# Patient Record
Sex: Female | Born: 1990 | Race: Black or African American | Hispanic: No | Marital: Single | Smoking: Current every day smoker
Health system: Southern US, Community
[De-identification: ages and names within clinical notes are randomized; demographics above are authoritative.]

## PROBLEM LIST (undated history)

## (undated) DIAGNOSIS — I10 Essential (primary) hypertension: Secondary | ICD-10-CM

## (undated) HISTORY — PX: CHOLECYSTECTOMY: SHX55

---

## 2019-03-05 ENCOUNTER — Emergency Department (HOSPITAL_BASED_OUTPATIENT_CLINIC_OR_DEPARTMENT_OTHER): Payer: Self-pay

## 2019-03-05 ENCOUNTER — Encounter (HOSPITAL_BASED_OUTPATIENT_CLINIC_OR_DEPARTMENT_OTHER): Payer: Self-pay | Admitting: Emergency Medicine

## 2019-03-05 ENCOUNTER — Other Ambulatory Visit: Payer: Self-pay

## 2019-03-05 ENCOUNTER — Emergency Department (HOSPITAL_BASED_OUTPATIENT_CLINIC_OR_DEPARTMENT_OTHER)
Admission: EM | Admit: 2019-03-05 | Discharge: 2019-03-05 | Disposition: A | Discharge status: Home / Self Care | Payer: Self-pay | Attending: Emergency Medicine | Admitting: Emergency Medicine

## 2019-03-05 DIAGNOSIS — M791 Myalgia, unspecified site: Secondary | ICD-10-CM | POA: Insufficient documentation

## 2019-03-05 DIAGNOSIS — J111 Influenza due to unidentified influenza virus with other respiratory manifestations: Secondary | ICD-10-CM | POA: Insufficient documentation

## 2019-03-05 DIAGNOSIS — I10 Essential (primary) hypertension: Secondary | ICD-10-CM | POA: Insufficient documentation

## 2019-03-05 DIAGNOSIS — R69 Illness, unspecified: Secondary | ICD-10-CM

## 2019-03-05 DIAGNOSIS — F17200 Nicotine dependence, unspecified, uncomplicated: Secondary | ICD-10-CM | POA: Insufficient documentation

## 2019-03-05 HISTORY — DX: Essential (primary) hypertension: I10

## 2019-03-05 LAB — PREGNANCY, URINE: Preg Test, Ur: NEGATIVE

## 2019-03-05 MED ORDER — ACETAMINOPHEN 325 MG PO TABS
650.0000 mg | ORAL_TABLET | Freq: Once | ORAL | Status: AC
  Administered 2019-03-05: 650 mg via ORAL
  Filled 2019-03-05: qty 2

## 2019-03-05 NOTE — ED Provider Notes (Signed)
[facility] EMERGENCY DEPARTMENT Provider Note   CSN: [id number] Arrival date & time: 03/05/19  1150    History   Chief Complaint Chief Complaint  Patient presents with  . Cough    HPI Semaje Davies is a 28 y.o. female presented today for concern of flulike symptoms that began last night.  Patient reports that multiple people including coworkers and her boyfriend have recently tested positive for Influenza A.  Patient states that she first noticed body aches last night just before going to bed.  Upon waking this morning patient states that her symptoms have increased.  She endorses a mild intermittent cough productive with white sputum, generalized body aches without focal tenderness and fever/chills.  Patient took 1 dose of DayQuil around 6 AM with moderate relief of symptoms.  Patient does endorse nausea without vomiting.  Patient reports that she has a history of hypertension, no other known chronic medical diseases and no immunocompromising diseases.  Patient states that she did not get her flu shot this year.  Patient denies headache, neck pain/stiffness, chest pain, shortness of breath, abdominal pain, vomiting/diarrhea, rash, vision changes or any other concerns today.  She denies history of blood clot, recent travel, recent surgery/immobilization, extremity swelling or pain, hemoptysis, history of cancer hormone use.    Past Medical History:  Diagnosis Date  . Hypertension     There are no active problems to display for this patient.   Past Surgical History:  Procedure Laterality Date  . CHOLECYSTECTOMY       OB History   No obstetric history on file.      Home Medications    Prior to Admission medications   Not on File    Family History No family history on file.  Social History Social History   Tobacco Use  . Smoking status: Current Every Day Smoker  . Smokeless tobacco: Never Used  Substance Use Topics  . Alcohol use: Yes  . Drug use:  Not on file     Allergies   Patient has no known allergies.   Review of Systems Review of Systems  Constitutional: Positive for chills and fever.  HENT: Positive for rhinorrhea. Negative for facial swelling, sinus pain, sore throat, trouble swallowing and voice change.   Respiratory: Positive for cough. Negative for shortness of breath.   Cardiovascular: Negative.  Negative for chest pain, palpitations and leg swelling.  Gastrointestinal: Positive for nausea. Negative for diarrhea and vomiting.  Musculoskeletal: Positive for arthralgias and myalgias. Negative for neck pain and neck stiffness.  Neurological: Negative.  Negative for dizziness, syncope, weakness, light-headedness and headaches.  All other systems reviewed and are negative.  Physical Exam Updated Vital Signs BP (!) 143/88 (BP Location: Right Arm)   Pulse (!) 104   Temp 99.7 F (37.6 C) (Oral)   Resp 18   Ht 5\' 6"  (1.676 m)   Wt 106.6 kg   LMP 02/04/2019   SpO2 96%   BMI 37.93 kg/m   Physical Exam Constitutional:      General: She is not in acute distress.    Appearance: Normal appearance. She is well-developed. She is not ill-appearing or diaphoretic.  HENT:     Head: Normocephalic and atraumatic.     Jaw: There is normal jaw occlusion. No trismus.     Right Ear: Tympanic membrane, ear canal and external ear normal.     Left Ear: Tympanic membrane, ear canal and external ear normal.     Nose: Rhinorrhea present. Rhinorrhea  is clear.     Right Sinus: No maxillary sinus tenderness or frontal sinus tenderness.     Left Sinus: No maxillary sinus tenderness or frontal sinus tenderness.     Mouth/Throat:     Mouth: Mucous membranes are moist.     Pharynx: Oropharynx is clear.     Comments: The patient has normal phonation and is in control of secretions. No stridor.  Midline uvula without edema. Soft palate rises symmetrically. No tonsillar erythema, swelling or exudates. Tongue protrusion is normal, floor of  mouth is soft. No trismus. No creptius on neck palpation. No gingival erythema or fluctuance noted. Mucus membranes moist. Eyes:     General: Vision grossly intact. Gaze aligned appropriately.     Extraocular Movements: Extraocular movements intact.     Conjunctiva/sclera: Conjunctivae normal.     Pupils: Pupils are equal, round, and reactive to light.  Neck:     Musculoskeletal: Full passive range of motion without pain, normal range of motion and neck supple. No neck rigidity, crepitus or pain with movement.     Trachea: Trachea and phonation normal. No tracheal tenderness or tracheal deviation.     Meningeal: Brudzinski's sign absent.  Cardiovascular:     Rate and Rhythm: Regular rhythm. Tachycardia present.     Pulses: Normal pulses.     Heart sounds: Normal heart sounds.  Pulmonary:     Effort: Pulmonary effort is normal. No accessory muscle usage or respiratory distress.     Breath sounds: Normal breath sounds and air entry. No wheezing or rhonchi.  Chest:     Chest wall: No tenderness.  Abdominal:     General: Bowel sounds are normal. There is no distension.     Palpations: Abdomen is soft.     Tenderness: There is no abdominal tenderness. There is no guarding or rebound.  Musculoskeletal: Normal range of motion.        General: No tenderness.     Right lower leg: Normal. She exhibits no tenderness. No edema.     Left lower leg: Normal. She exhibits no tenderness. No edema.  Skin:    General: Skin is warm and dry.     Capillary Refill: Capillary refill takes less than 2 seconds.  Neurological:     Mental Status: She is alert.     GCS: GCS eye subscore is 4. GCS verbal subscore is 5. GCS motor subscore is 6.     Comments: Speech is clear and goal oriented, follows commands Major Cranial nerves without deficit, no facial droop Moves extremities without ataxia, coordination intact Normal gait  Psychiatric:        Behavior: Behavior normal.    ED Treatments / Results    Labs (all labs ordered are listed, but only abnormal results are displayed) Labs Reviewed  PREGNANCY, URINE    EKG None  Radiology Dg Chest 2 View  Result Date: 03/05/2019 CLINICAL DATA:  Cough and fever.  Smoker. EXAM: CHEST - 2 VIEW COMPARISON:  None. FINDINGS: The heart size and mediastinal contours are within normal limits. Both lungs are clear. The visualized skeletal structures are unremarkable. IMPRESSION: No active cardiopulmonary disease.  No evidence of pneumonia. Electronically Signed   By: Bary Richard M.D.   On: 03/05/2019 13:25    Procedures Procedures (including critical care time)  Medications Ordered in ED Medications  acetaminophen (TYLENOL) tablet 650 mg (650 mg Oral Given 03/05/19 1316)     Initial Impression / Assessment and Plan / ED Course  I have reviewed the triage vital signs and the nursing notes.  Pertinent labs & imaging results that were available during my care of the patient were reviewed by me and considered in my medical decision making (see chart for details).    28 year old female without history of immunocompromising diseases presents today for flulike symptoms that began last night.  Patient reports that her boyfriend tested positive for influenza A a few days ago and she feels that she is having similar symptoms.  Patient with rhinorrhea, fever/chills, nausea without vomiting, generalized body aches as well as mild intermittent cough with scant white sputum. - On arrival patient is well-appearing and in no acute distress.  Mild cough present.  Borderline febrile, mildly tachycardic and listed SPO2 of 94%.  Suspect influenza at this time.  During my evaluation patient's O2 saturation remained above 97% on room air.  She denies chest pain or shortness of breath.  I have low suspicion for acute cardiopulmonary process, ACS or PE at this time.  She is without known risk factors and is without pain or shortness of breath. Discussed with Dr. Clarene DukeLittle who  agrees with plan of CXR and tylenol. Patient was given 650 mg of Tylenol which improved both her fever and tachycardia.  Patient reassessed and endorses improvement of her body aches following Tylenol today.   CXR: FINDINGS: The heart size and mediastinal contours are within normal limits. Both lungs are clear. The visualized skeletal structures are unremarkable. IMPRESSION: No active cardiopulmonary disease.  No evidence of pneumonia.  - Patient with symptoms consistent with influenza, contact history supports this.  Vitals are stable, low-grade fever.  No signs of dehydration, tolerating PO's.  Lungs are clear. Discussed the cost versus benefit of Tamiflu treatment with the patient.  Patient has refuse Tamiflu today. Patient will be discharged with instructions to orally hydrate, rest, and use over-the-counter medications such as anti-inflammatories ibuprofen and Aleve for muscle aches and Tylenol for fever.   At discharge vital signs have improved.  She is well-appearing/nontoxic and in no acute distress.   At this time there does not appear to be any evidence of an acute emergency medical condition and the patient appears stable for discharge with appropriate outpatient follow up. Diagnosis was discussed with patient who verbalizes understanding of care plan and is agreeable to discharge. I have discussed return precautions with patient who verbalizes understanding of return precautions. Patient strongly encouraged to follow-up with their PCP. All questions answered.  Patient's case discussed with Dr. Clarene DukeLittle who agrees with plan to discharge with follow-up.   Note: Portions of this report may have been transcribed using voice recognition software. Every effort was made to ensure accuracy; however, inadvertent computerized transcription errors may still be present. Final Clinical Impressions(s) / ED Diagnoses   Final diagnoses:  Influenza-like illness    ED Discharge Orders    None         Elizabeth PalauMorelli, Brandon A, PA-C 03/05/19 1542    Little, Ambrose Finlandachel Morgan, MD 03/05/19 1555

## 2019-03-05 NOTE — Discharge Instructions (Addendum)
You have been diagnosed today with Influenza-Like Illness.  At this time there does not appear to be the presence of an emergent medical condition, however there is always the potential for conditions to change. Please read and follow the below instructions.  Please return to the Emergency Department immediately for any new or worsening symptoms or if your symptoms do not improve within 3 days. Please be sure to follow up with your Primary Care Provider within one week regarding your visit today; please call their office to schedule an appointment even if you are feeling better for a follow-up visit. Please take Ibuprofen (Advil, motrin) and Tylenol (acetaminophen) to relieve your pain.  You may take up to 400 MG (2 pills) of normal strength ibuprofen every 8 hours as needed.  In between doses of ibuprofen you make take tylenol, up to 500 mg (one extra strength pill).  Do not take more than 3,000 mg tylenol in a 24 hour period.  Please check all medication labels as many medications such as pain and cold medications may contain tylenol.  Do not drink alcohol while taking these medications.  Do not take other NSAID'S while taking ibuprofen (such as aleve or naproxen).  Please take ibuprofen with food to decrease stomach upset. Please drink plenty of water over the next few days to avoid dehydration. Please get plenty of rest. Please avoid close contact with the sick, elderly or young to avoid spreading the flu.  Get help right away if: You develop shortness of breath or difficulty breathing. Your skin or nails turn a bluish color. You have severe pain or stiffness in your neck. You develop a sudden headache or sudden pain in your face or ear. You cannot eat or drink without vomiting. You have: Chest pain. Diarrhea. A fever. Your cough gets worse. You produce more mucus. You feel nauseous or you vomit. Any new or concerning symptoms  Please read the additional information packets attached to  your discharge summary.

## 2019-03-05 NOTE — ED Notes (Signed)
Pt walked by this RN to front desk for check out.

## 2019-03-05 NOTE — ED Triage Notes (Signed)
Cough and body aches since yesterday.

## 2019-08-30 IMAGING — CR DG CHEST 2V
2 series · 2 of 2 positions shown · non-contrast
Comparison: None.

CLINICAL DATA: Cough and fever.  Smoker.

EXAM:
CHEST - 2 VIEW

[w chest pa]
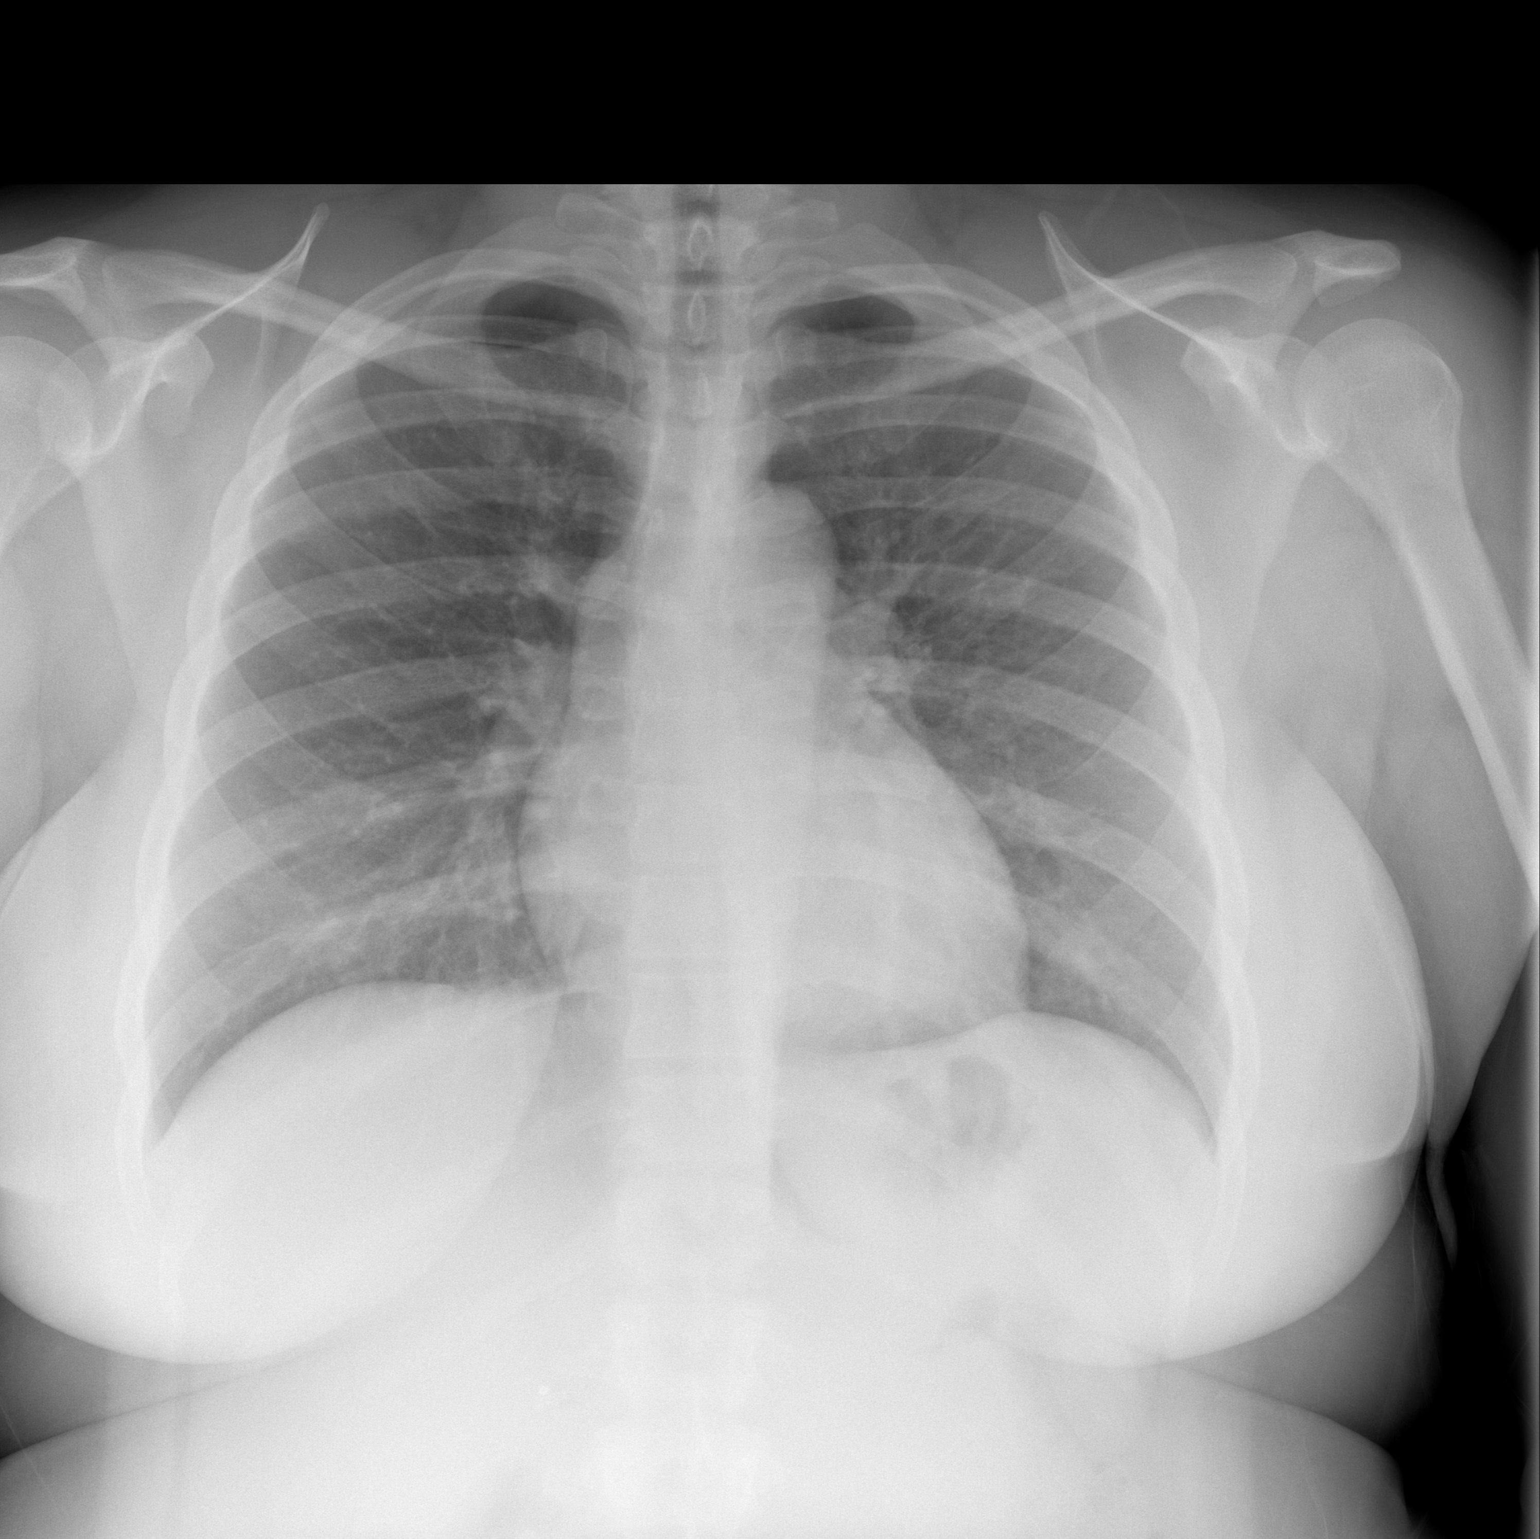

[w chest lat]
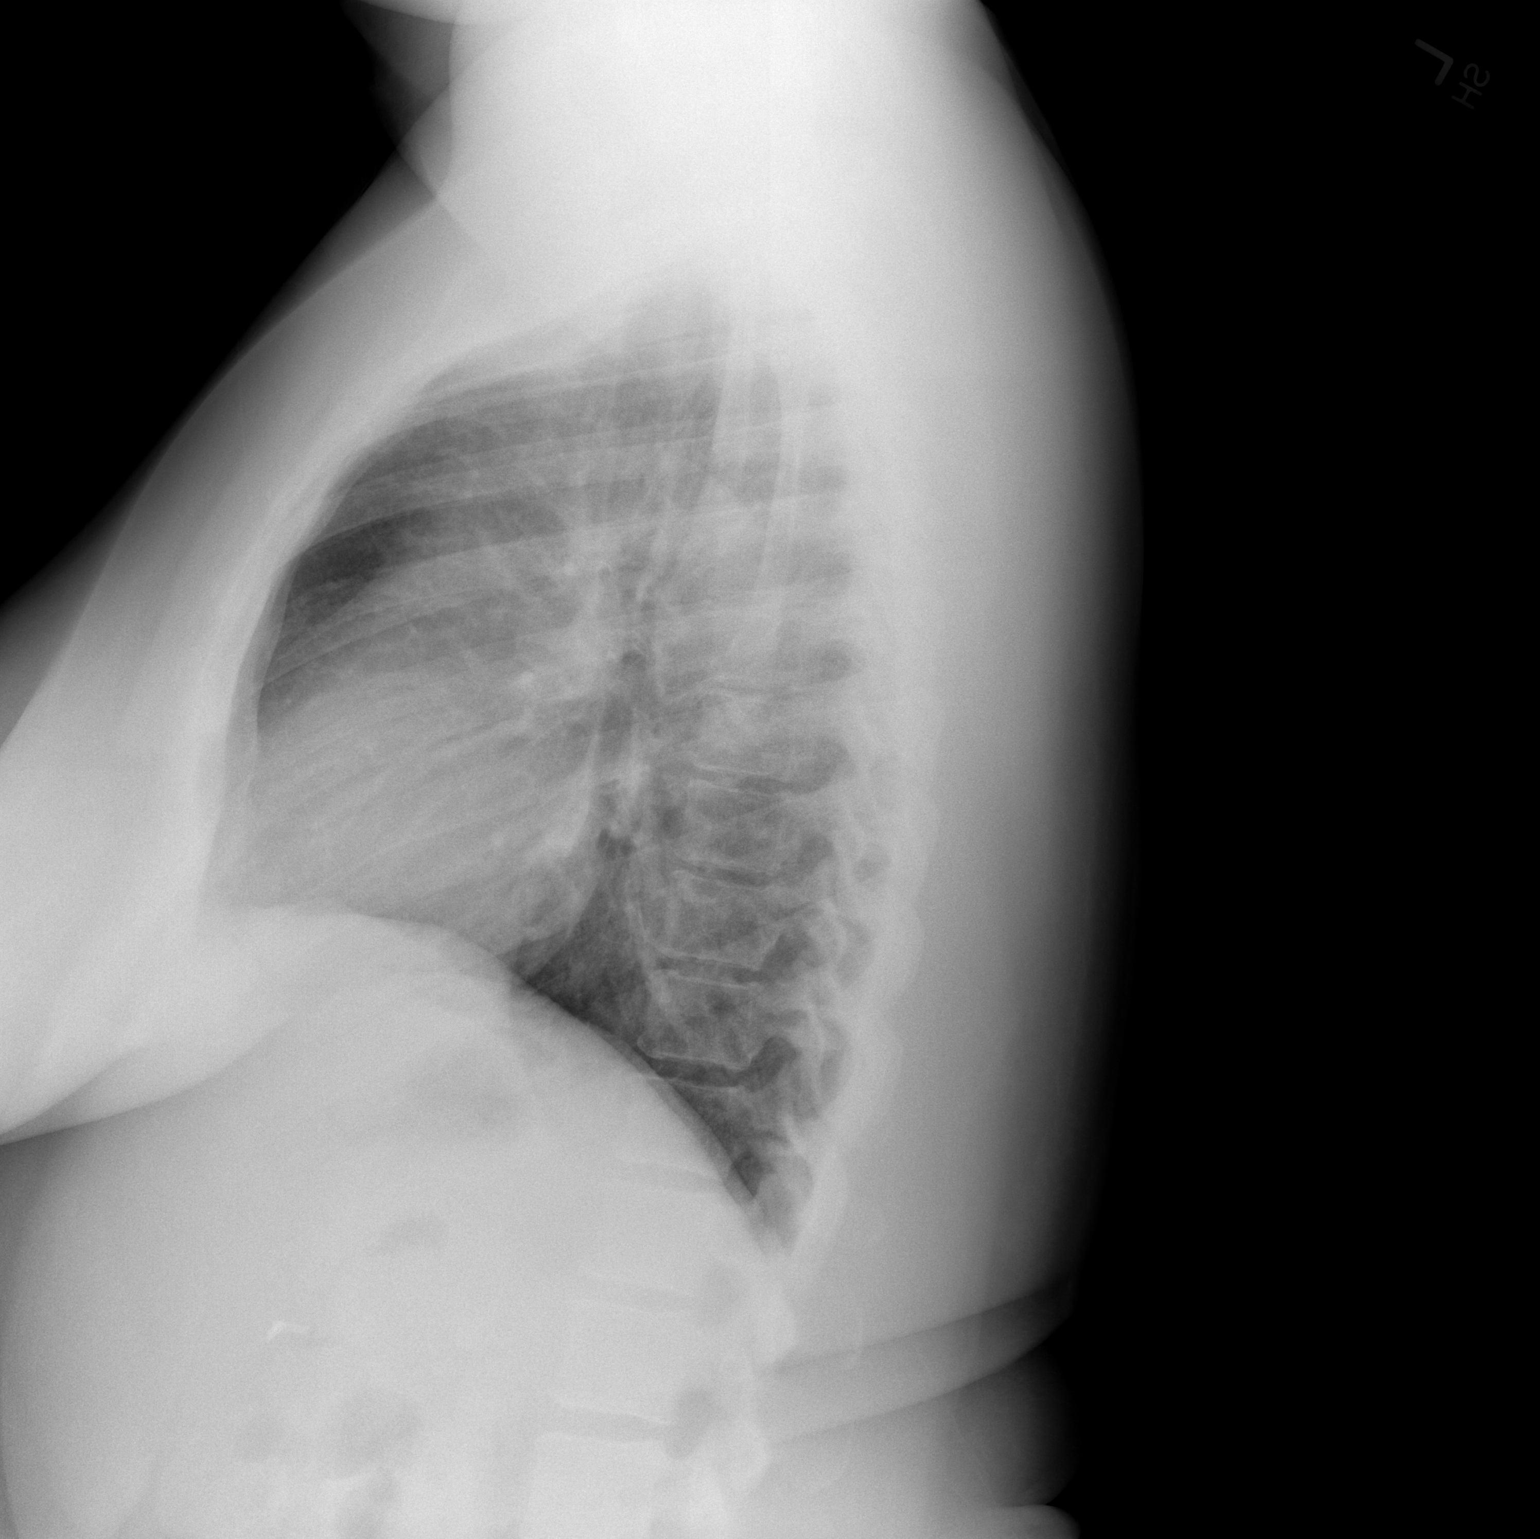

[2 of 2 positions shown; findings below may reference images not displayed]

FINDINGS: The heart size and mediastinal contours are within normal limits.
Both lungs are clear. The visualized skeletal structures are
unremarkable.
IMPRESSION: No active cardiopulmonary disease.  No evidence of pneumonia.

## 2025-03-23 ENCOUNTER — Ambulatory Visit: Payer: Self-pay | Admitting: Family Medicine
